# Patient Record
Sex: Male | Born: 1990 | Race: Black or African American | Hispanic: No | Marital: Single | Smoking: Never smoker
Health system: Southern US, Community
[De-identification: ages and names within clinical notes are randomized; demographics above are authoritative.]

## PROBLEM LIST (undated history)

## (undated) DIAGNOSIS — R195 Other fecal abnormalities: Secondary | ICD-10-CM

## (undated) HISTORY — PX: ULNAR COLLATERAL LIGAMENT REPAIR: SHX6159

## (undated) HISTORY — PX: ROTATOR CUFF REPAIR: SHX139

## (undated) HISTORY — DX: Other fecal abnormalities: R19.5

---
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
## 2014-10-02 ENCOUNTER — Other Ambulatory Visit (INDEPENDENT_AMBULATORY_CARE_PROVIDER_SITE_OTHER): Payer: Managed Care, Other (non HMO)

## 2014-10-02 ENCOUNTER — Ambulatory Visit (INDEPENDENT_AMBULATORY_CARE_PROVIDER_SITE_OTHER): Payer: Managed Care, Other (non HMO) | Admitting: Internal Medicine

## 2014-10-02 ENCOUNTER — Encounter: Payer: Self-pay | Admitting: Internal Medicine

## 2014-10-02 ENCOUNTER — Ambulatory Visit: Payer: 59 | Admitting: Internal Medicine

## 2014-10-02 VITALS — BP 116/78 | HR 60 | Temp 98.7°F | Resp 12 | Ht 71.0 in | Wt 169.0 lb

## 2014-10-02 DIAGNOSIS — Z0000 Encounter for general adult medical examination without abnormal findings: Secondary | ICD-10-CM

## 2014-10-02 LAB — COMPREHENSIVE METABOLIC PANEL
ALBUMIN: 4.3 g/dL (ref 3.5–5.2)
ALT: 134 U/L — AB (ref 0–53)
AST: 242 U/L — ABNORMAL HIGH (ref 0–37)
Alkaline Phosphatase: 68 U/L (ref 39–117)
BUN: 16 mg/dL (ref 6–23)
CALCIUM: 10 mg/dL (ref 8.4–10.5)
CHLORIDE: 99 meq/L (ref 96–112)
CO2: 31 meq/L (ref 19–32)
CREATININE: 1.37 mg/dL (ref 0.40–1.50)
GFR: 83.85 mL/min (ref >60.00)
Glucose, Bld: 93 mg/dL (ref 70–99)
POTASSIUM: 4.5 meq/L (ref 3.5–5.1)
Sodium: 134 mEq/L — ABNORMAL LOW (ref 135–145)
TOTAL PROTEIN: 7.4 g/dL (ref 6.0–8.3)
Total Bilirubin: 0.5 mg/dL (ref 0.2–1.2)

## 2014-10-02 LAB — LIPID PANEL
CHOLESTEROL: 179 mg/dL (ref 0–200)
HDL: 71.1 mg/dL (ref >39.00)
LDL CALC: 88 mg/dL (ref 0–99)
NonHDL: 107.9
TRIGLYCERIDES: 102 mg/dL (ref 0.0–149.0)
Total CHOL/HDL Ratio: 3
VLDL: 20.4 mg/dL (ref 0.0–40.0)

## 2014-10-02 NOTE — Progress Notes (Signed)
   Subjective:    Patient ID: Joel Leach, male    DOB: July 10, 1990, 24 y.o.   MRN: [id number]  HPI The patient is a 24 YO man who is coming in today for wellness. 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   [facility], social history reviewed and updated with the patient.   Review of Systems  Constitutional: Negative for fever, chills, activity change, appetite change, fatigue and unexpected weight change.  HENT: Negative.   Eyes: Negative.   Respiratory: Negative for cough, chest tightness, shortness of breath and wheezing.   Cardiovascular: Negative for chest pain, palpitations and leg swelling.  Gastrointestinal: Negative for nausea, abdominal pain, diarrhea, constipation and abdominal distention.  Musculoskeletal: Negative.   Skin: Negative.   Neurological: Negative.   Psychiatric/Behavioral: Negative.       Objective:   Physical Exam  Constitutional: He is oriented to person, place, and time. He appears well-developed and well-nourished.  HENT:  Head: Normocephalic and atraumatic.  Eyes: EOM are normal.  Neck: Normal range of motion.  Cardiovascular: Normal rate and regular rhythm.   Pulmonary/Chest: Effort normal and breath sounds normal. No respiratory distress. He has no wheezes. He has no rales.  Abdominal: Soft. He exhibits no distension. There is no tenderness. There is no rebound.  Musculoskeletal: He exhibits no edema.  Neurological: He is alert and oriented to person, place, and time. Coordination normal.  Skin: Skin is warm and dry.  Psychiatric: He has a normal mood and affect.   Filed Vitals:   10/02/14 1406  BP: 116/78  Pulse: 60  Temp: 98.7 F (37.1 C)  TempSrc: Oral  Resp: 12  Height: 5\' 11"  (1.803 m)  Weight: 169 lb (76.658 kg)  SpO2: 97%      Assessment & Plan:

## 2014-10-02 NOTE — Patient Instructions (Signed)
We will check you for basic labs and STDs including HIV. We will call you back with the results even if everything is normal and negative.   Come back in about 2 years or sooner if you are having any problems or questions.   Good luck with the personal training!  Health Maintenance - 82-22 Years Old SCHOOL PERFORMANCE After high school, you may attend college or technical or vocational school, enroll in the [employer], or enter the workforce. PHYSICAL, SOCIAL, AND EMOTIONAL DEVELOPMENT  One hour of regular physical activity daily is recommended. Continue to participate in sports.  Develop your own interests and consider community service or volunteerism.  Make decisions about college and work plans.  Throughout these years, you should assume responsibility for your own health care. Increasing independence is important for you.  You may be exploring your sexual identity. Understand that you should never be in a situation that makes you feel uncomfortable, and tell your partner if you do not want to engage in sexual activity.  Body image may become important to you. Be mindful that eating disorders can develop at this time. Talk to your parents or other caregivers if you have concerns about body image, weight gain, or losing weight.  You may notice mood disturbances, depression, anxiety, attention problems, or trouble with alcohol. Talk to your health care provider if you have concerns about mental illness.  Set limits for yourself and talk with your parents or other caregivers about independent decision making.  Handle conflict without physical violence.  Avoid loud noises which may impair hearing.  Limit television and computer time to 2 hours each day. Individuals who engage in excessive inactivity are more likely to become overweight. RECOMMENDED IMMUNIZATIONS  Influenza vaccine.  All adults should be immunized every year.  All adults, including pregnant women and people with  hives-only allergy to eggs, can receive the inactivated influenza (IIV) vaccine.  Adults aged 18-49 years can receive the recombinant influenza (RIV) vaccine. The RIV vaccine does not contain any egg protein.  Tetanus, diphtheria, and acellular pertussis (Td, Tdap) vaccine.  Pregnant women should receive 1 dose of Tdap vaccine during each pregnancy. The dose should be obtained regardless of the length of time since the last dose. Immunization is preferred during the 27th to 36th week of gestation.  An adult who has not previously received Tdap or who does not know his or her vaccine status should receive 1 dose of Tdap. This initial dose should be followed by tetanus and diphtheria toxoids (Td) booster doses every 10 years.  Adults with an unknown or incomplete history of completing a 3-dose immunization series with Td-containing vaccines should begin or complete a primary immunization series including a Tdap dose.  Adults should receive a Td booster every 10 years.  Varicella vaccine.  An adult without evidence of immunity to varicella should receive 2 doses or a second dose if he or she has previously received 1 dose.  Pregnant females who do not have evidence of immunity should receive the first dose after pregnancy. This first dose should be obtained before leaving the health care facility. The second dose should be obtained 4-8 weeks after the first dose.  Human papillomavirus (HPV) vaccine.  Females aged 13-26 years who have not received the vaccine previously should obtain the 3-dose series.  The vaccine is not recommended for pregnant females. However, pregnancy testing is not needed before receiving a dose. If a male is found to be pregnant after receiving a dose,  no treatment is needed. In that case, the remaining doses should be delayed until after the pregnancy.  Males aged 83-21 years who have not received the vaccine previously should receive the 3-dose series. Males aged  22-26 years may be immunized.  Immunization is recommended through the age of 90 years for any male who has sex with males and did not get any or all doses earlier.  Immunization is recommended for any person with an immunocompromised condition through the age of 64 years if he or she did not get any or all doses earlier.  During the 3-dose series, the second dose should be obtained 4-8 weeks after the first dose. The third dose should be obtained 24 weeks after the first dose and 16 weeks after the second dose.  Measles, mumps, and rubella (MMR) vaccine.  Adults born in 18 or later should have 1 or more doses of MMR vaccine unless there is a contraindication to the vaccine or there is laboratory evidence of immunity to each of the three diseases.  A routine second dose of MMR vaccine should be obtained at least 28 days after the first dose for students attending postsecondary schools, health care workers, and international travelers.  For females of childbearing age, rubella immunity should be determined. If there is no evidence of immunity, females who are not pregnant should be vaccinated. If there is no evidence of immunity, females who are pregnant should delay immunization until after pregnancy.  Pneumococcal 13-valent conjugate (PCV13) vaccine.  When indicated, a person who is uncertain of his or her immunization history and has no record of immunization should receive the PCV13 vaccine.  An adult aged 25 years or older who has certain medical conditions and has not been previously immunized should receive 1 dose of PCV13 vaccine. This PCV13 should be followed with a dose of pneumococcal polysaccharide (PPSV23) vaccine. The PPSV23 vaccine dose should be obtained at least 8 weeks after the dose of PCV13 vaccine.  An adult aged 76 years or older who has certain medical conditions and previously received 1 or more doses of PPSV23 vaccine should receive 1 dose of PCV13. The PCV13 vaccine  dose should be obtained 1 or more years after the last PPSV23 vaccine dose.  Pneumococcal polysaccharide (PPSV23) vaccine.  When PCV13 is also indicated, PCV13 should be obtained first.  An adult younger than age 70 years who has certain medical conditions should be immunized.  Any person who resides in a long-term care facility should be immunized.  An adult smoker should be immunized.  People with an immunocompromised condition and certain other conditions should receive both PCV13 and PPSV23 vaccines.  People with human immunodeficiency virus (HIV) infection should be immunized as soon as possible after diagnosis.  Immunization during chemotherapy or radiation therapy should be avoided.  Routine use of PPSV23 vaccine is not recommended for American Indians, [place] Natives, or people younger than 65 years unless there are medical conditions that require PPSV23 vaccine.  When indicated, people who have unknown immunization and have no record of immunization should receive PPSV23 vaccine.  One-time revaccination 5 years after the first dose of PPSV23 is recommended for people aged 19-64 years who have chronic kidney failure, nephrotic syndrome, asplenia, or immunocompromised conditions.  Meningococcal vaccine.  Adults with asplenia or persistent complement component deficiencies should receive 2 doses of quadrivalent meningococcal conjugate (MenACWY-D) vaccine. The doses should be obtained at least 2 months apart.  Microbiologists working with certain meningococcal bacteria, [employer] recruits, people at  risk during an outbreak, and people who travel to or live in countries with a high rate of meningitis should be immunized.  A first-year college student up through age 52 years who is living in a residence hall should receive a dose if he or she did not receive a dose on or after his or her 16th birthday.  Adults who have certain high-risk conditions should receive one or more doses of  vaccine.  Hepatitis A vaccine.  Adults who wish to be protected from this disease, have certain high-risk conditions, work with hepatitis A-infected animals, work in hepatitis A research labs, or travel to or work in countries with a high rate of hepatitis A should be immunized.  Adults who were previously unvaccinated and who anticipate close contact with an international adoptee during the first 60 days after arrival in the [place] from a country with a high rate of hepatitis A should be immunized.  Hepatitis B vaccine.  Adults who wish to be protected from this disease, have certain high-risk conditions, may be exposed to blood or other infectious body fluids, are household contacts or sex partners of hepatitis B positive people, are clients or workers in certain care facilities, or travel to or work in countries with a high rate of hepatitis B should be immunized.  Haemophilus influenzae type b (Hib) vaccine.  A previously unvaccinated person with asplenia or sickle cell disease or having a scheduled splenectomy should receive 1 dose of Hib vaccine.  Regardless of previous immunization, a recipient of a hematopoietic stem cell transplant should receive a 3-dose series 6-12 months after his or her successful transplant.  Hib vaccine is not recommended for adults with HIV infection. TESTING  Annual screening for vision and hearing problems is recommended. Vision should be screened at least once between 71-4 years of age.  You may be screened for anemia or tuberculosis.  You should have a blood test to check for high cholesterol.  You should be screened for alcohol and drug use.  If you are sexually active, you may be screened for sexually transmitted infections (STIs), pregnancy, or HIV. You should be screened for STIs if:  Your sexual activity has changed since the last screening test, and you are at an increased risk for chlamydia or gonorrhea. Ask your health care provider  if you are at risk.  If you are at an increased risk for hepatitis B, you should be screened for this virus. You are considered at high risk for hepatitis B if you:  Were born in a country where hepatitis B occurs often. Talk with your health care provider about which countries are considered high risk.  Have parents who were born in a high-risk country and have not received a shot to protect against hepatitis B (hepatitis B vaccine).  Have HIV or AIDS.  Use needles to inject street drugs.  Live with or have sex with someone who has hepatitis B.  Are a man who has sex with other men (MSM).  Get hemodialysis treatment.  Take certain medicines for conditions like cancer, organ transplantation, or autoimmune conditions. NUTRITION   You should:  Have three servings of low-fat milk and dairy products daily. If you do not drink milk or consume dairy products, you should eat calcium-enriched foods, such as juice, bread, or cereal. Dark, leafy greens or canned fish are alternate sources of calcium.  Drink plenty of water. Fruit juice should be limited to 8-12 oz (240-360 mL) each day. Sugary beverages  and sodas should be avoided.  Avoid eating foods high in fat, salt, or sugar, such as chips, candy, and cookies.  Avoid fast foods and limit eating out at restaurants.  Try not to skip meals, especially breakfast. You should eat a variety of vegetables, fruits, and lean meats.  Eat meals together as a family whenever possible. ORAL HEALTH Brush your teeth twice a day and floss at least once a day. You should have two dental exams a year.  SKIN CARE You should wear sunscreen when out in the sun. TALK TO SOMEONE ABOUT:  Precautions against pregnancy, contraception, and sexually transmitted infections.  Taking a prescription medicine daily to prevent HIV infection if you are at risk of being infected with HIV. This is called preexposure prophylaxis (PrEP). You are at risk if you:  Are a  male who has sex with other males (MSM).  Are heterosexual and sexually active with more than one partner.  Take drugs by injection.  Are sexually active with a partner who has HIV.  Whether you are at high risk of being infected with HIV. If you choose to begin PrEP, you should first be tested for HIV. You should then be tested every 3 months for as long as you are taking PrEP.  Drug, tobacco, and alcohol use among your friends or at friends' homes. Smoking tobacco or marijuana and taking drugs have health consequences and may impact your brain development.  Appropriate use of over-the-counter or prescription medicines.  Driving guidelines and riding with friends.  The risks of drinking and driving or boating. Call someone if you have been drinking or using drugs and need a ride. WHAT'S NEXT? Visit your pediatrician or family physician once a year. By young adulthood, you should transition from your pediatrician to a family physician or internal medicine specialist. If you are a male and are sexually active, you may want to begin annual physical exams with a gynecologist. Document Released: 07/31/2006 Document Revised: 05/10/2013 Document Reviewed: 08/20/2006 [facility] Patient Information 2015 [place], [place]. This information is not intended to replace advice given to you by your health care provider. Make sure you discuss any questions you have with your health care provider.

## 2014-10-02 NOTE — Progress Notes (Signed)
Pre visit review using our clinic review tool, if applicable. No additional management support is needed unless otherwise documented below in the visit note. 

## 2014-10-02 NOTE — Assessment & Plan Note (Signed)
Checking labs including STD screening. Up to date on health screening and thinks he got a tetanus with recent shoulder surgery. Talked to him about STD transmission and prevention and he understands and had several questions. He is having safe sex.

## 2014-10-03 LAB — GC/CHLAMYDIA PROBE AMP, URINE

## 2014-10-03 LAB — HIV ANTIBODY (ROUTINE TESTING W REFLEX): HIV 1&2 Ab, 4th Generation: NONREACTIVE

## 2014-10-06 ENCOUNTER — Other Ambulatory Visit: Payer: Self-pay | Admitting: Internal Medicine

## 2014-10-06 DIAGNOSIS — R7989 Other specified abnormal findings of blood chemistry: Secondary | ICD-10-CM

## 2014-10-06 DIAGNOSIS — R945 Abnormal results of liver function studies: Secondary | ICD-10-CM

## 2015-10-22 ENCOUNTER — Other Ambulatory Visit (INDEPENDENT_AMBULATORY_CARE_PROVIDER_SITE_OTHER): Payer: Managed Care, Other (non HMO)

## 2015-10-22 ENCOUNTER — Ambulatory Visit (INDEPENDENT_AMBULATORY_CARE_PROVIDER_SITE_OTHER): Payer: Managed Care, Other (non HMO) | Admitting: Internal Medicine

## 2015-10-22 ENCOUNTER — Encounter: Payer: Self-pay | Admitting: Internal Medicine

## 2015-10-22 VITALS — BP 122/68 | HR 64 | Temp 98.4°F | Resp 12 | Ht 71.0 in | Wt 182.0 lb

## 2015-10-22 DIAGNOSIS — R945 Abnormal results of liver function studies: Secondary | ICD-10-CM

## 2015-10-22 DIAGNOSIS — Z23 Encounter for immunization: Secondary | ICD-10-CM | POA: Diagnosis not present

## 2015-10-22 DIAGNOSIS — R7989 Other specified abnormal findings of blood chemistry: Secondary | ICD-10-CM | POA: Diagnosis not present

## 2015-10-22 DIAGNOSIS — Z0000 Encounter for general adult medical examination without abnormal findings: Secondary | ICD-10-CM

## 2015-10-22 LAB — CBC
HEMATOCRIT: 47 % (ref 39.0–52.0)
HEMOGLOBIN: 16 g/dL (ref 13.0–17.0)
MCHC: 33.9 g/dL (ref 30.0–36.0)
MCV: 85.7 fl (ref 78.0–100.0)
Platelets: 169 10*3/uL (ref 150.0–400.0)
RBC: 5.49 Mil/uL (ref 4.22–5.81)
RDW: 12.6 % (ref 11.5–15.5)
WBC: 4.8 10*3/uL (ref 4.0–10.5)

## 2015-10-22 LAB — ACUTE HEP PANEL AND HEP B SURFACE AB
HCV Ab: NEGATIVE
HEP A IGM: NONREACTIVE
HEP B S AB: NEGATIVE
Hep B C IgM: NONREACTIVE
Hepatitis B Surface Ag: NEGATIVE

## 2015-10-22 LAB — COMPREHENSIVE METABOLIC PANEL
ALBUMIN: 4.7 g/dL (ref 3.5–5.2)
ALK PHOS: 72 U/L (ref 39–117)
ALT: 22 U/L (ref 0–53)
AST: 39 U/L — AB (ref 0–37)
BUN: 16 mg/dL (ref 6–23)
CHLORIDE: 101 meq/L (ref 96–112)
CO2: 31 mEq/L (ref 19–32)
CREATININE: 1.35 mg/dL (ref 0.40–1.50)
Calcium: 9.9 mg/dL (ref 8.4–10.5)
GFR: 84.47 mL/min (ref >60.00)
GLUCOSE: 92 mg/dL (ref 70–99)
POTASSIUM: 4.6 meq/L (ref 3.5–5.1)
SODIUM: 137 meq/L (ref 135–145)
TOTAL PROTEIN: 8 g/dL (ref 6.0–8.3)
Total Bilirubin: 0.6 mg/dL (ref 0.2–1.2)

## 2015-10-22 LAB — GAMMA GT: GGT: 34 U/L (ref 7–51)

## 2015-10-22 NOTE — Assessment & Plan Note (Signed)
Elevated on recent labs and he did not return for hepatitis screening. Checking today and counseled about medicines or supplements that can cause problems. If still elevated will refer to GI.

## 2015-10-22 NOTE — Patient Instructions (Signed)
We are going to recheck the liver levels today and call you back with the results.   Last time they were slightly up which means that the liver is working extra hard. We are checking for any hepatitis which is a viral infection of the liver. If the levels are still higher than normal we will send you to the GI doctor to check for any genetic diseases that can cause the liver to work extra hard.   Liver Function Tests Liver function tests are blood tests to see how well your liver is working. The proteins and enzymes measured in the test can alert your health care provider to inflammation, damage, or disease in your liver. It is common to have liver function tests:  During annual physical exams.  When you are taking certain medicines.  If you have liver disease.  If you drink a lot of alcohol.  When you are not feeling well.  When you have other conditions that may affect the liver. Substances measured may include:   Alanine transaminase (ALT). This is an enzyme in the liver.  Aspartate transaminase (AST). This is an enzyme in the liver, heart, and muscles.  Alkaline phosphatase (ALP). This is a protein in the liver, bile ducts, and bone. It is also in other body tissues.  Total bilirubin. This is a yellow pigment in bile.  Albumin. This is a protein in the liver.  Prothrombin time and international normalized ratio (PT and INR). PT measures the time that it takes for your blood to clot. INR is a calculation of blood clotting time based upon your PT result. It is also calculated based on normal ranges defined by the laboratory that processed your lab test.  Total protein. This measures two proteins, albumin and globulin, found in the blood. PREPARATION FOR TEST How you prepare will depend on which tests are being done and the reason why these tests are being done. You may need to:  Avoid eating for 4-6 hours before the test or as directed by your health care provider.  Stop  taking certain medicines prior to your blood test as directed by your health care provider. RESULTS  It is your responsibility to obtain your test results. Ask the lab or department performing the test when and how you will get your results. Contact your health care provider to discuss any questions you have about your results. RANGE OF NORMAL VALUES Ranges for normal values may vary among different labs and hospitals. You should always check with your health care provider after having lab work or other tests done to discuss the meaning of your test results and whether your values are considered within normal limits. The following are normal ranges for substances measured in liver function tests: ALT  Infant: may be twice as high as adult values.  Child or adult: 4-36 international units/L at 37C or 4-36 units/L (SI units).  Elderly: may be slightly higher than adult values. AST  Newborn 70-44 days old: 35-140 units/L.  Child under 29 years old: 15-60 units/L.  33-36 years old: 15-50 units/L.  40-31 years old: 10-50 units/L.  15-84 years old: 10-40 units/L.  Adult: 0-35 units/L or 0-0.58 microkatal/L (SI units).  Elderly: slightly higher than adults. ALP  Child under 87 years old: 85-235 units/L.  35-80 years old: 65-210 units/L.  58-11 years old: 60-300 units/L.  72-41 years old: 30-200 units/L.  Adult: 30-120 units/L or 0.5-2.0 microkatal/L (SI units).  Elderly: slightly higher than adult. Total bilirubin  Newborn: 1.0-12.0 mg/dL or 98.1-19117.1-205 micromoles/L (SI units).  Adult, elderly, or child: 0.3-1.0 mg/dL or 4.7-825.1-17 micromoles/L. Albumin  Premature infant: 3.0-4.2 g/dL.  Newborn: 3.5-5.4 g/dL.  Infant: 4.4-5.4 g/dL.  Child: 4.0-5.9 g/dL.  Adult or elderly: 3.5-5.0 g/dL or 95-6235-50 g/L (SI units). PT  11.0-12.5 seconds; 85%-100%. INR  0.8-1.1. Total protein  Premature infant: 4.2-7.6 g/dL.  Newborn: 4.6-7.4 g/dL.  Infant: 6.0-6.7 g/dL.  Child: 6.2-8.0  g/dL.  Adult or elderly: 6.4-8.3 g/dL or 13-0864-83 g/L (SI units). MEANING OF RESULTS OUTSIDE NORMAL VALUE RANGES Sometimes test results can be abnormal due to other factors, such as medicines, exercise, or pregnancy. Follow up with your health care provider if you have any questions about test results outside the normal value ranges. ALT  Levels above the normal range, along with other test results, may indicate liver disease. AST  Levels above the normal range, along with other test results, may indicate liver disease. Sometimes levels also increase after burns, surgery, heart attack, muscle damage, or seizure. ALP  Levels above the normal range, along with other test results, may indicate biliary obstruction, diseases of the liver, bone disease, thyroid disease, tumors, fractures, leukemia or lymphoma, or several other conditions. People with blood type O or B may show higher levels after a fatty meal.  Levels below the normal range, along with other test results, may indicate bone and teeth conditions, malnutrition, protein deficiency, or Wilson disease. Total bilirubin  Levels above the normal range, along with other test results, may indicate problems with the liver, gallbladder, or bile ducts. Albumin  Levels above the normal range, along with other test results, may indicate dehydration. They may also be caused by a diet that is high in protein. Sometimes, the band placed around the upper arm during the process of drawing blood can cause the level of this protein in your blood to rise and give you a result above the normal range.  Levels below the normal range, along with other tests results, may indicate kidney disease, liver disease, or malabsorption of nutrients. PT and INR  Levels above the normal range mean your blood is clotting slower than normal. This may be due to blood disorders, liver disorders, or low levels of vitamin K. Total protein  Levels above the normal range,  along with other test results, may be due to infection or other diseases.  Levels below the normal range, along with other test results, may be due to an immune system disorder, bleeding, burns, kidney disorder, liver disease, trouble absorbing or getting enough nutrients, or other conditions that affect the intestines.   This information is not intended to replace advice given to you by your health care provider. Make sure you discuss any questions you have with your health care provider.   Document Released: 06/07/2004 Document Revised: 05/26/2014 Document Reviewed: 09/08/2013 Elsevier Interactive Patient Education [employer]2016 Elsevier Inc.

## 2015-10-22 NOTE — Progress Notes (Signed)
   Subjective:    Patient ID: Joel Leach, male    DOB: [birth year], 25 y.o.   MRN: [id number]  HPI The patient is a 25 YO man coming in for wellness. No new concerns but some intermittent abdominal cramping when he works out and does not drink enough water. 1 episode of blood in stool in the last year but overall doing well.   [facility] reviewed and updated.   Review of Systems  Constitutional: Negative for fever, chills, activity change, appetite change, fatigue and unexpected weight change.  HENT: Negative.   Eyes: Negative.   Respiratory: Negative for cough, chest tightness, shortness of breath and wheezing.   Cardiovascular: Negative for chest pain, palpitations and leg swelling.  Gastrointestinal: Negative for nausea, abdominal pain, diarrhea, constipation and abdominal distention.  Musculoskeletal: Negative.   Skin: Negative.   Neurological: Negative.   Psychiatric/Behavioral: Negative.       Objective:   Physical Exam  Constitutional: He is oriented to person, place, and time. He appears well-developed and well-nourished.  HENT:  Head: Normocephalic and atraumatic.  Eyes: EOM are normal.  Neck: Normal range of motion.  Cardiovascular: Normal rate and regular rhythm.   Pulmonary/Chest: Effort normal and breath sounds normal. No respiratory distress. He has no wheezes. He has no rales.  Abdominal: Soft. He exhibits no distension. There is no tenderness. There is no rebound.  Musculoskeletal: He exhibits no edema.  Neurological: He is alert and oriented to person, place, and time. Coordination normal.  Skin: Skin is warm and dry.  Psychiatric: He has a normal mood and affect.   Filed Vitals:   10/22/15 1037  BP: 122/68  Pulse: 64  Temp: 98.4 F (36.9 C)  TempSrc: Oral  Resp: 12  Height: 5\' 11"  (1.803 m)  Weight: 182 lb (82.555 kg)  SpO2: 98%      Assessment & Plan:

## 2015-10-22 NOTE — Assessment & Plan Note (Signed)
Checking labs today, adjust as indicated. Immunizations up to date. Exercises regularly and non-smoker. Counseled on health needs. Given screening recommendations.

## 2015-10-22 NOTE — Progress Notes (Signed)
Pre visit review using our clinic review tool, if applicable. No additional management support is needed unless otherwise documented below in the visit note. 

## 2015-10-29 ENCOUNTER — Ambulatory Visit: Payer: Managed Care, Other (non HMO) | Admitting: Internal Medicine

## 2017-11-27 ENCOUNTER — Encounter: Payer: Managed Care, Other (non HMO) | Admitting: Internal Medicine

## 2017-11-27 ENCOUNTER — Encounter: Payer: Self-pay | Admitting: Internal Medicine

## 2017-11-30 ENCOUNTER — Other Ambulatory Visit (INDEPENDENT_AMBULATORY_CARE_PROVIDER_SITE_OTHER): Payer: BC Managed Care – PPO

## 2017-11-30 ENCOUNTER — Encounter: Payer: Self-pay | Admitting: Internal Medicine

## 2017-11-30 ENCOUNTER — Ambulatory Visit (INDEPENDENT_AMBULATORY_CARE_PROVIDER_SITE_OTHER): Payer: BC Managed Care – PPO | Admitting: Internal Medicine

## 2017-11-30 VITALS — BP 124/78 | HR 58 | Temp 98.7°F | Ht 71.0 in | Wt 183.0 lb

## 2017-11-30 DIAGNOSIS — Z0000 Encounter for general adult medical examination without abnormal findings: Secondary | ICD-10-CM

## 2017-11-30 DIAGNOSIS — Z23 Encounter for immunization: Secondary | ICD-10-CM | POA: Diagnosis not present

## 2017-11-30 LAB — COMPREHENSIVE METABOLIC PANEL
ALBUMIN: 4.5 g/dL (ref 3.5–5.2)
ALT: 17 U/L (ref 0–53)
AST: 31 U/L (ref 0–37)
Alkaline Phosphatase: 65 U/L (ref 39–117)
BILIRUBIN TOTAL: 0.5 mg/dL (ref 0.2–1.2)
BUN: 15 mg/dL (ref 6–23)
CALCIUM: 9.6 mg/dL (ref 8.4–10.5)
CO2: 32 mEq/L (ref 19–32)
CREATININE: 1.48 mg/dL (ref 0.40–1.50)
Chloride: 100 mEq/L (ref 96–112)
GFR: 74.61 mL/min (ref >60.00)
Glucose, Bld: 97 mg/dL (ref 70–99)
Potassium: 4.2 mEq/L (ref 3.5–5.1)
Sodium: 137 mEq/L (ref 135–145)
TOTAL PROTEIN: 7.4 g/dL (ref 6.0–8.3)

## 2017-11-30 LAB — CBC
HCT: 43.6 % (ref 39.0–52.0)
Hemoglobin: 14.9 g/dL (ref 13.0–17.0)
MCHC: 34.2 g/dL (ref 30.0–36.0)
MCV: 87.8 fl (ref 78.0–100.0)
PLATELETS: 156 10*3/uL (ref 150.0–400.0)
RBC: 4.97 Mil/uL (ref 4.22–5.81)
RDW: 13 % (ref 11.5–15.5)
WBC: 5 10*3/uL (ref 4.0–10.5)

## 2017-11-30 LAB — LIPID PANEL
CHOLESTEROL: 191 mg/dL (ref 0–200)
HDL: 80.4 mg/dL (ref >39.00)
LDL Cholesterol: 98 mg/dL (ref 0–99)
NonHDL: 110.6
TRIGLYCERIDES: 62 mg/dL (ref 0.0–149.0)
Total CHOL/HDL Ratio: 2
VLDL: 12.4 mg/dL (ref 0.0–40.0)

## 2017-11-30 NOTE — Patient Instructions (Signed)
We have given you the first hepatitis B shot today, come back for the next one in 1 month.    Health Maintenance, Male A healthy lifestyle and preventive care is important for your health and wellness. Ask your health care provider about what schedule of regular examinations is right for you. What should I know about weight and diet? Eat a Healthy Diet  Eat plenty of vegetables, fruits, whole grains, low-fat dairy products, and lean protein.  Do not eat a lot of foods high in solid fats, added sugars, or salt.  Maintain a Healthy Weight Regular exercise can help you achieve or maintain a healthy weight. You should:  Do at least 150 minutes of exercise each week. The exercise should increase your heart rate and make you sweat (moderate-intensity exercise).  Do strength-training exercises at least twice a week.  Watch Your Levels of Cholesterol and Blood Lipids  Have your blood tested for lipids and cholesterol every 5 years starting at 25 years of age. If you are at high risk for heart disease, you should start having your blood tested when you are 25 years old. You may need to have your cholesterol levels checked more often if: ? Your lipid or cholesterol levels are high. ? You are older than 25 years of age. ? You are at high risk for heart disease.  What should I know about cancer screening? Many types of cancers can be detected early and may often be prevented. Lung Cancer  You should be screened every year for lung cancer if: ? You are a current smoker who has smoked for at least 30 years. ? You are a former smoker who has quit within the past 15 years.  Talk to your health care provider about your screening options, when you should start screening, and how often you should be screened.  Colorectal Cancer  Routine colorectal cancer screening usually begins at 25 years of age and should be repeated every 5-10 years until you are 25 years old. You may need to be screened more  often if early forms of precancerous polyps or small growths are found. Your health care provider may recommend screening at an earlier age if you have risk factors for colon cancer.  Your health care provider may recommend using home test kits to check for hidden blood in the stool.  A small camera at the end of a tube can be used to examine your colon (sigmoidoscopy or colonoscopy). This checks for the earliest forms of colorectal cancer.  Prostate and Testicular Cancer  Depending on your age and overall health, your health care provider may do certain tests to screen for prostate and testicular cancer.  Talk to your health care provider about any symptoms or concerns you have about testicular or prostate cancer.  Skin Cancer  Check your skin from head to toe regularly.  Tell your health care provider about any new moles or changes in moles, especially if: ? There is a change in a mole's size, shape, or color. ? You have a mole that is larger than a pencil eraser.  Always use sunscreen. Apply sunscreen liberally and repeat throughout the day.  Protect yourself by wearing long sleeves, pants, a wide-brimmed hat, and sunglasses when outside.  What should I know about heart disease, diabetes, and high blood pressure?  If you are 5018-25 years of age, have your blood pressure checked every 3-5 years. If you are 25 years of age or older, have your blood pressure  checked every year. You should have your blood pressure measured twice-once when you are at a hospital or clinic, and once when you are not at a hospital or clinic. Record the average of the two measurements. To check your blood pressure when you are not at a hospital or clinic, you can use: ? An automated blood pressure machine at a pharmacy. ? A home blood pressure monitor.  Talk to your health care provider about your target blood pressure.  If you are between 75-57 years old, ask your health care provider if you should take  aspirin to prevent heart disease.  Have regular diabetes screenings by checking your fasting blood sugar level. ? If you are at a normal weight and have a low risk for diabetes, have this test once every three years after the age of 62. ? If you are overweight and have a high risk for diabetes, consider being tested at a younger age or more often.  A one-time screening for abdominal aortic aneurysm (AAA) by ultrasound is recommended for men aged 72-75 years who are current or former smokers. What should I know about preventing infection? Hepatitis B If you have a higher risk for hepatitis B, you should be screened for this virus. Talk with your health care provider to find out if you are at risk for hepatitis B infection. Hepatitis C Blood testing is recommended for:  Everyone born from [birth year] through 1963.  Anyone with known risk factors for hepatitis C.  Sexually Transmitted Diseases (STDs)  You should be screened each year for STDs including gonorrhea and chlamydia if: ? You are sexually active and are younger than 24 years of age. ? You are older than 25 years of age and your health care provider tells you that you are at risk for this type of infection. ? Your sexual activity has changed since you were last screened and you are at an increased risk for chlamydia or gonorrhea. Ask your health care provider if you are at risk.  Talk with your health care provider about whether you are at high risk of being infected with HIV. Your health care provider may recommend a prescription medicine to help prevent HIV infection.  What else can I do?  Schedule regular health, dental, and eye exams.  Stay current with your vaccines (immunizations).  Do not use any tobacco products, such as cigarettes, chewing tobacco, and e-cigarettes. If you need help quitting, ask your health care provider.  Limit alcohol intake to no more than 2 drinks per day. One drink equals 12 ounces of beer, 5 ounces of  wine, or 1 ounces of hard liquor.  Do not use street drugs.  Do not share needles.  Ask your health care provider for help if you need support or information about quitting drugs.  Tell your health care provider if you often feel depressed.  Tell your health care provider if you have ever been abused or do not feel safe at home. This information is not intended to replace advice given to you by your health care provider. Make sure you discuss any questions you have with your health care provider. Document Released: 11/01/2007 Document Revised: 01/02/2016 Document Reviewed: 02/06/2015 Elsevier Interactive Patient Education  [employer].

## 2017-11-30 NOTE — Assessment & Plan Note (Signed)
Needs repeat hep b series. Will give hep a/b today. Checking labs and STD screening. Flu shot yearly, tetanus up to date. Counseled about sun safety and dangers of distracted driving. Given screening recommendations.

## 2017-11-30 NOTE — Progress Notes (Signed)
   Subjective:    Patient ID: Joel Leach, male    DOB: 04-04-1991, 27 y.o.   MRN: [id number]  HPI The patient is a 27 YO man coming in for physical. No new concerns. Wants STD screening.  [facility], [facility], social history reviewed and updated.   Review of Systems  Constitutional: Negative.   HENT: Negative.   Eyes: Negative.   Respiratory: Negative for cough, chest tightness and shortness of breath.   Cardiovascular: Negative for chest pain, palpitations and leg swelling.  Gastrointestinal: Negative for abdominal distention, abdominal pain, constipation, diarrhea, nausea and vomiting.  Musculoskeletal: Negative.   Skin: Negative.   Neurological: Negative.   Psychiatric/Behavioral: Negative.       Objective:   Physical Exam  Constitutional: He is oriented to person, place, and time. He appears well-developed and well-nourished.  HENT:  Head: Normocephalic and atraumatic.  Eyes: EOM are normal.  Neck: Normal range of motion.  Cardiovascular: Normal rate and regular rhythm.  Pulmonary/Chest: Effort normal and breath sounds normal. No respiratory distress. He has no wheezes. He has no rales.  Abdominal: Soft. Bowel sounds are normal. He exhibits no distension. There is no tenderness. There is no rebound.  Musculoskeletal: He exhibits no edema.  Neurological: He is alert and oriented to person, place, and time. Coordination normal.  Skin: Skin is warm and dry.  Psychiatric: He has a normal mood and affect.   Vitals:   11/30/17 1548  BP: 124/78  Pulse: (!) 58  Temp: 98.7 F (37.1 C)  TempSrc: Oral  SpO2: 98%  Weight: 183 lb (83 kg)  Height: 5\' 11"  (1.803 m)      Assessment & Plan:  Hep A/B vaccine given at visit

## 2017-12-01 LAB — HIV ANTIBODY (ROUTINE TESTING W REFLEX): HIV: NONREACTIVE

## 2017-12-02 LAB — GC/CHLAMYDIA PROBE AMP
Chlamydia trachomatis, NAA: NEGATIVE
Neisseria gonorrhoeae by PCR: NEGATIVE

## 2017-12-03 ENCOUNTER — Telehealth: Payer: Self-pay | Admitting: Internal Medicine

## 2017-12-03 NOTE — Telephone Encounter (Signed)
Error

## 2018-01-04 ENCOUNTER — Ambulatory Visit: Payer: BC Managed Care – PPO

## 2018-09-26 ENCOUNTER — Other Ambulatory Visit: Payer: Self-pay

## 2018-09-26 ENCOUNTER — Encounter (HOSPITAL_COMMUNITY): Payer: Self-pay | Admitting: Emergency Medicine

## 2018-09-26 ENCOUNTER — Ambulatory Visit (HOSPITAL_COMMUNITY)
Admission: EM | Admit: 2018-09-26 | Discharge: 2018-09-26 | Disposition: A | Discharge status: Home / Self Care | Payer: BC Managed Care – PPO | Attending: Family Medicine | Admitting: Family Medicine

## 2018-09-26 DIAGNOSIS — S39012A Strain of muscle, fascia and tendon of lower back, initial encounter: Secondary | ICD-10-CM

## 2018-09-26 MED ORDER — DICLOFENAC SODIUM 75 MG PO TBEC: 75.0000 mg | DELAYED_RELEASE_TABLET | Freq: Two times a day (BID) | ORAL | 0 refills | Status: AC

## 2018-09-26 NOTE — Discharge Instructions (Addendum)
Hip raises with straight leg raised will loosen the back muscles.
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
